# Patient Record
Sex: Male | Born: 1980 | Race: White | Hispanic: No | Marital: Married | Smoking: Never smoker
Health system: Southern US, Community
[De-identification: ages and names within clinical notes are randomized; demographics above are authoritative.]

## PROBLEM LIST (undated history)

## (undated) DIAGNOSIS — C8190 Hodgkin lymphoma, unspecified, unspecified site: Secondary | ICD-10-CM

## (undated) HISTORY — PX: ELBOW SURGERY: SHX618

---

## 2004-12-19 ENCOUNTER — Emergency Department (HOSPITAL_COMMUNITY): Admission: EM | Admit: 2004-12-19 | Discharge: 2004-12-19 | Payer: Self-pay | Admitting: Emergency Medicine

## 2005-06-23 ENCOUNTER — Emergency Department (HOSPITAL_COMMUNITY): Admission: EM | Admit: 2005-06-23 | Discharge: 2005-06-23 | Payer: Self-pay | Admitting: Emergency Medicine

## 2005-07-23 ENCOUNTER — Emergency Department (HOSPITAL_COMMUNITY): Admission: EM | Admit: 2005-07-23 | Discharge: 2005-07-23 | Payer: Self-pay | Admitting: Emergency Medicine

## 2005-08-25 ENCOUNTER — Emergency Department (HOSPITAL_COMMUNITY): Admission: EM | Admit: 2005-08-25 | Discharge: 2005-08-25 | Payer: Self-pay | Admitting: Emergency Medicine

## 2005-10-10 ENCOUNTER — Emergency Department (HOSPITAL_COMMUNITY): Admission: EM | Admit: 2005-10-10 | Discharge: 2005-10-10 | Payer: Self-pay | Admitting: Emergency Medicine

## 2005-11-16 ENCOUNTER — Emergency Department (HOSPITAL_COMMUNITY): Admission: EM | Admit: 2005-11-16 | Discharge: 2005-11-16 | Payer: Self-pay | Admitting: Emergency Medicine

## 2006-01-16 ENCOUNTER — Emergency Department (HOSPITAL_COMMUNITY): Admission: EM | Admit: 2006-01-16 | Discharge: 2006-01-16 | Payer: Self-pay | Admitting: Emergency Medicine

## 2006-01-19 ENCOUNTER — Emergency Department (HOSPITAL_COMMUNITY): Admission: EM | Admit: 2006-01-19 | Discharge: 2006-01-19 | Payer: Self-pay | Admitting: Emergency Medicine

## 2006-02-08 ENCOUNTER — Emergency Department (HOSPITAL_COMMUNITY): Admission: EM | Admit: 2006-02-08 | Discharge: 2006-02-08 | Payer: Self-pay | Admitting: Emergency Medicine

## 2006-03-11 ENCOUNTER — Emergency Department (HOSPITAL_COMMUNITY): Admission: EM | Admit: 2006-03-11 | Discharge: 2006-03-11 | Payer: Self-pay | Admitting: Emergency Medicine

## 2006-10-03 ENCOUNTER — Ambulatory Visit: Payer: Self-pay | Admitting: Internal Medicine

## 2006-10-05 ENCOUNTER — Ambulatory Visit (HOSPITAL_COMMUNITY): Admission: RE | Admit: 2006-10-05 | Discharge: 2006-10-05 | Payer: Self-pay | Admitting: Internal Medicine

## 2006-10-05 ENCOUNTER — Encounter: Payer: Self-pay | Admitting: Internal Medicine

## 2006-10-05 ENCOUNTER — Ambulatory Visit: Payer: Self-pay | Admitting: Internal Medicine

## 2006-10-14 ENCOUNTER — Ambulatory Visit (HOSPITAL_COMMUNITY): Admission: RE | Admit: 2006-10-14 | Discharge: 2006-10-14 | Payer: Self-pay | Admitting: Internal Medicine

## 2006-12-08 ENCOUNTER — Ambulatory Visit: Payer: Self-pay | Admitting: Internal Medicine

## 2006-12-13 ENCOUNTER — Encounter (HOSPITAL_COMMUNITY): Admission: RE | Admit: 2006-12-13 | Discharge: 2007-01-04 | Payer: Self-pay | Admitting: Internal Medicine

## 2007-01-12 ENCOUNTER — Ambulatory Visit: Payer: Self-pay | Admitting: Family Medicine

## 2007-01-12 DIAGNOSIS — C8190 Hodgkin lymphoma, unspecified, unspecified site: Secondary | ICD-10-CM | POA: Insufficient documentation

## 2007-01-12 DIAGNOSIS — I252 Old myocardial infarction: Secondary | ICD-10-CM | POA: Insufficient documentation

## 2007-01-12 DIAGNOSIS — G43009 Migraine without aura, not intractable, without status migrainosus: Secondary | ICD-10-CM | POA: Insufficient documentation

## 2007-01-12 DIAGNOSIS — F329 Major depressive disorder, single episode, unspecified: Secondary | ICD-10-CM

## 2007-01-12 DIAGNOSIS — F411 Generalized anxiety disorder: Secondary | ICD-10-CM | POA: Insufficient documentation

## 2007-01-12 DIAGNOSIS — K219 Gastro-esophageal reflux disease without esophagitis: Secondary | ICD-10-CM | POA: Insufficient documentation

## 2007-01-18 ENCOUNTER — Encounter (INDEPENDENT_AMBULATORY_CARE_PROVIDER_SITE_OTHER): Payer: Self-pay | Admitting: Family Medicine

## 2007-01-25 ENCOUNTER — Encounter (INDEPENDENT_AMBULATORY_CARE_PROVIDER_SITE_OTHER): Payer: Self-pay | Admitting: Family Medicine

## 2007-01-26 ENCOUNTER — Ambulatory Visit: Payer: Self-pay | Admitting: Family Medicine

## 2007-01-26 DIAGNOSIS — K8020 Calculus of gallbladder without cholecystitis without obstruction: Secondary | ICD-10-CM | POA: Insufficient documentation

## 2007-04-14 ENCOUNTER — Emergency Department (HOSPITAL_COMMUNITY): Admission: EM | Admit: 2007-04-14 | Discharge: 2007-04-14 | Payer: Self-pay | Admitting: Emergency Medicine

## 2007-04-19 ENCOUNTER — Emergency Department (HOSPITAL_COMMUNITY): Admission: EM | Admit: 2007-04-19 | Discharge: 2007-04-19 | Payer: Self-pay | Admitting: Emergency Medicine

## 2009-05-17 IMAGING — NM NM HEPATO W/GB/PHARM/[PERSON_NAME]
2 series · 12 of 12 positions shown · non-contrast
Comparison: none

HISTORY: Abdominal pain

[Series 1: hepatobiliary · 3.20mm/px · 6 of 60 frames shown (1 of 2)]
[frame 6/60]
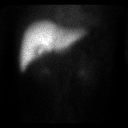
[frame 16/60]
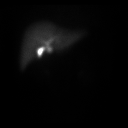
[frame 26/60]
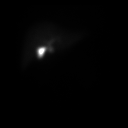
[frame 36/60]
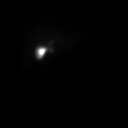
[frame 46/60]
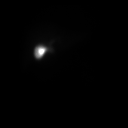
[frame 56/60]
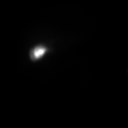

[Series 1: hepatobiliary · 3.20mm/px · 6 of 60 frames shown (2 of 2)]
[frame 6/60]
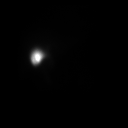
[frame 16/60]
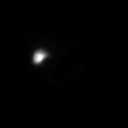
[frame 26/60]
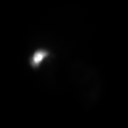
[frame 36/60]
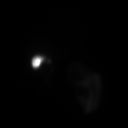
[frame 46/60]
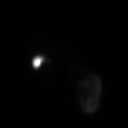
[frame 56/60]
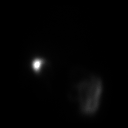

[12 of 12 positions shown; findings below may reference images not displayed]

HEPATOBILIARY SCAN WITH EJECTION FRACTION:

Hepatobiliary imaging performed using 5 mCi Fc-QQm mebrofenin.

Prompt tracer extraction from blood stream, indicating normal hepatocellular
function.
Prompt excretion of tracer into biliary tree.
Gallbladder visualized x 10 minutes.
Small bowel activity not definitely identified until following fatty meal
stimulation.
No focal hepatic retention of tracer.

At 1 hour, patient ingested half-and-half, and imaging was continued for 60
minutes.
Subjectively. low normal emptying of tracer occurs from gallbladder following
fatty meal stimulation.
Calculated gallbladder ejection fraction 53%, normal.
IMPRESSION: Normal exam.

## 2010-01-25 ENCOUNTER — Encounter: Payer: Self-pay | Admitting: Internal Medicine

## 2010-05-19 NOTE — Assessment & Plan Note (Signed)
NAMEMARKEZ, DOWLAND                  CHART#:  [id number]   DATE:  12/08/2006                       DOB:  1980-01-31   1. Follow up eosinophilic esophagitis, biopsy-proven.  2. Epigastric pain, cholelithiasis on ultrasound.  3. History of lymphoma.  4. History of recurrent pleural effusion requiring pleurodesis      previously.   Corey Gray returns after being seen by me on October 05, 2006.  He  underwent an EGD.  Esophagus appeared abnormal.  Biopsies were  consistent with eosinophilic esophagitis.  Patient also had grade 2 to 3  esophageal varices.  There was an abnormal appearing area in the antrum  which had a leukoplakia type appearing mucosa.  His esophagus was  dilated at that time as well and multiple biopsies of antrum were taken  for histologic study.  Biopsies of gastric mucosa revealed a chronic  __________ gastritis with no evidence of H. pylori.  There are some  mucosal erosions and microthrombi present.  There was no evidence of  intestinal metaplasia or malignancy identified.  His lymphoma has been  in remission now for nearly 5 years.   Corey Gray has lost 3 pounds since his original visit here in September.  He does not have any early satiety, he does not have any nausea,  vomiting, melena, hematochezia.  His dysphagia symptoms have improved  and reflux symptoms improved significantly after going on a course of  orally administered fluticasone.  He continues to take Protonix 40 mg  orally b.i.d.  He has a history of recurrent pulmonary and pericardial  effusions which required draining previously in [place].  He has  undergone pleurodesis.  No one told him as to the etiology of the  pleural effusion.  Apparently there was no evidence of recurrent  lymphoma.  Apparently he did have a Port-A-Cath at one point and did  have some venous clotting (query portal vein thrombosis with his  esophageal varices seen recently).  Labs from October 1 demonstrated an  indirect  bilirubin slightly elevated at 1, albumin 3.7, transaminases  were normal.  He had a mildly depressed hematocrit to 38.4%, white count  5.3.  INR is 1.  Corey Gray tells me his esophagus is really not the  problem now days.  He has continued postprandial epigastric pain with  some radiation in the right upper quadrant.  It comes and goes.  Sometimes he has pain without eating, but generally it is after he eats  a meal.   CURRENT MEDICATIONS:  See updated list.   ALLERGIES:  1. VIOXX.  2. PENICILLIN.   EXAM:  Today, he appears well, he is accompanied by his wife, weight  214, is down 3 pounds.  Height 6 feet 4 inches, temp 97.9, BP 112/78,  pulse 68.  Skin warm and dry, there is no jaundice.  HEENT EXAM:  No scleral icterus, conjunctiva are pink.  There is no  cervical adenopathy.  CHEST:  Lungs are clear to auscultation.  CARDIAC EXAM:  Regular rate and rhythm without murmur, gallop, rub.  ABDOMEN:  Flat, positive bowel sounds, soft, nontender without  appreciable mass or organomegaly.  EXTREMITY EXAM:  No edema.   IMPRESSION:  1. Eosinophilic esophagitis, biopsy-proven; responded to topical      fluticasone and now proton pump inhibitor therapy.  Dysphagia is      not an issue.  I told him at some point between now and the first      of the year he ought to go see an allergist to be tested for      allergens as this is often very helpful in quelling the immune      response in the esophagus.  2. Postprandial epigastric right upper quadrant abdominal pain.  He      has a single gallstone seen in his gallbladder on prior ultrasound.      He has esophageal varices and may have a splenic vein and/or portal      vein thrombosis related to prior episode of Port-A-Cath      chemotherapy for lymphoma.  I do not believe this gentleman has      advanced chronic liver disease.   RECOMMENDATIONS:  Will proceed with a HIDA, with fatty meal, in the very  near future to further evaluate  this gentleman for gallbladder  dysfunction and if this study is suggestive of gallbladder dysfunction  will more towards cholecystectomy.  However, I think it would be a good  idea to get a CT of the abdomen and pelvis prior to any surgical  intervention to see how his intraabdominal anatomy looks.       Jonathon Bellows, M.D.  Electronically Signed     RMR/MEDQ  D:  12/08/2006  T:  12/08/2006  Job:  [id number]   cc:   Dineen Kid. Elijah Birk, M.D.

## 2010-05-19 NOTE — Op Note (Signed)
NAME:  Corey Gray, Corey Gray                 ACCOUNT NO.:  [id number]   MEDICAL RECORD NO.:  [id number]          PATIENT TYPE:  AMB   LOCATION:  DAY                           FACILITY:  [facility]   PHYSICIAN:  R. Roetta Sessions, M.D. DATE OF BIRTH:  1980-04-25   DATE OF PROCEDURE:  10/05/2006  DATE OF DISCHARGE:                               OPERATIVE REPORT   PROCEDURE:  EGD with Elease Hashimoto dilation followed by biopsy.   INDICATIONS FOR PROCEDURE:  A 30 year old gentleman with history of  recurrent esophageal dysphagia.  He has had his esophagus dilated  multiple times previously.  He also has heartburn all the time for which  he takes Prilosec and more recently Protonix 40 grams daily and he tells  me these agents really do not make much of a dent in his dysphagia  symptoms, history of lymphoma back in 1999.  He is in remission.  He is  followed by Dr. Concepcion Living up in [place], [place].  EGD is now  being done.  This approach has discussed with the patient at length.  Potential risks, benefits, alternatives have been reviewed, questions  answered, he is agreeable.  Please see documentation in the medical  record.   PROCEDURE NOTE:  O2 saturation, blood pressure, pulse and respirations  were monitored throughout the entire procedure.   CONSCIOUS SEDATION:  Versed 6 mg IV, Demerol 125 mg IV, Phenergan 12.5  mg diluted, slow IV push to augment conscious sedation.  Cetacaine spray  for topical pharyngeal anesthesia.   INSTRUMENTATION:  Pentax video chip system.   FINDINGS:  Esophagus:  Examination of the tubular esophagus revealed an  unusually diffusely pale-appearing esophageal mucosa.  The patient had  three columns of grade 2-3 esophageal varices proximally.  The tubular  esophagus was patent through the EG junction.  I did not see a ring,  stricture or web.  There was no evidence of esophagitis and moreover  there was no evidence of trachealization or a feline appearance of the  esophagoscope to go with typical changes of eosinophilic esophagitis.  EG junction was easily traversed.   Stomach:  Gastric cavity was emptied and insufflated well with air.  Thorough examination of the gastric mucosa including retroflexed view of  the proximal stomach and esophagogastric junction demonstrated very  abnormal-appearing gastric mucosa.  There was a large area of verrucous-  appearing mucosa in a geographic distribution in the area of the antrum  up to the incisura.  It had an appearance of leukoplakia like lesion.  There was no erosion or ulcer crater seen.  Please see multiple  photographs taken.  Proximal stomach:  Evaluation of proximal stomach  demonstrated only a small hiatal hernia.  Pylorus was patent and easily  traversed.  Examination of the bulb and second portion revealed no  abnormalities.   THERAPEUTIC/DIAGNOSTIC MANEUVERS PERFORMED:  A 54-French Maloney dilator  was passed to full insertion with ease.  Look-back revealed a  superficial tear of the EG junction.  There was minimal bleeding with  this maneuver.  Subsequently, multiple biopsies of the abnormal-  appearing gastric  mucosa were taken for histologic study.  Subsequently,  mucosal biopsies of the esophagus were taken away from the varices and  the superficial tear at the EG junction for histologic study.  There was  minimal bleeding after all the above-mentioned maneuvers amounting to an  estimated blood loss less than 10 mL.  The patient tolerated the  procedure well as reactive to endoscopy.   IMPRESSION:  1. Diffusely abnormal, unusually pale-appearing esophageal mucosa.      Three columns of grade 2 to 3 esophageal varices proximally.  No      structural narrowing of the esophagus to account for dysphagia,      status post passage of 54-French [facility] dilator.  Status postop      esophageal biopsy.  2. Small hiatal hernia.  A very abnormal appearing area of gastric      mucosa as described  above, biopsied separately.  Patent pylorus,      normal D1 and D2.  These findings are interesting.  He does indeed      have esophageal varices for which he will need further evaluation.  3. The esophagus did not appear to be that seen typically with      eosinophilic esophagitis.  He took a Production assistant, radio      without any in the usual tearing after the procedure which we      typically seen with larger bore dilation in the setting of      eosinophilic esophagitis, esophageal mucosa biopsies will help sort      that out.  4. His gastric mucosa is very abnormal, etiology of which is not      certain.  Biopsies are pending.  For the time being, we will      continue acid suppression with Protonix 40 mg orally daily.  5. Follow up on pathology.  6. Hepatic ultrasound, CBC, LFTs and INR today.  Further      recommendations to follow.      Jonathon Bellows, M.D.  Electronically Signed     RMR/MEDQ  D:  10/05/2006  T:  10/05/2006  Job:  [id number]   cc:   Concepcion Living, M.D.  [place], [place]

## 2010-05-19 NOTE — H&P (Signed)
NAME:  Corey Gray, Corey Gray                 ACCOUNT NO.:  [id number]   MEDICAL RECORD NO.:  [id number]          PATIENT TYPE:  AMB   LOCATION:  DAY                           FACILITY:  [facility]   PHYSICIAN:  R. Roetta Sessions, M.D. DATE OF BIRTH:  1980-06-22   DATE OF ADMISSION:  DATE OF DISCHARGE:  LH                              HISTORY & PHYSICAL   CHIEF COMPLAINT:  Solid food dysphagia for a year.   HISTORY OF PRESENT ILLNESS:  Corey Gray is a 30 year old Caucasian male  who has a history of recurrent solid food dysphagia.  He tells me that  his last esophageal dilatation was approximately 18 months ago in  [place], [place].  He has a history of recurrent dysphagia, mostly to  solid foods.  He has had problems for about a year now.  He is taking  Prilosec 20 mg b.i.d.  He does have breakthrough heartburn and  indigestion, almost every time he eats.  He complains of nausea and  vomiting.  He has symptoms intermittently throughout the day.  He also  complains of upper abdominal pain below both rib cages, as well as  abdominal bloating.  He generally has between 2 and 3 soft, brown bowel  movements per day, denies rectal bleeding or melena.  He has previously  failed Nexium and Protonix.  His weight remains stable.   PAST MEDICAL AND SURGICAL HISTORY:  He had [facility] lymphoma in 1999  status post radiation and chemotherapy, and he was followed by Dr.  Francis Gaines.  He has history of chronic GERD with esophageal dysphagia,  status post multiple esophageal dilatations at [facility].  He has had right ankle surgery with plates and screws.  He  has had thoracentesis.   CURRENT MEDICATIONS:  1. Prilosec 20 mg b.i.d.  2. Lortab 10/100 mg q.4-6 hours p.r.n.  3. Flexeril 10 mg three times a day.  4. Triazolam 0.25 mg q.h.s.  5. Gabapentin 100 mg t.i.d.  6. Furosemide 20 mg daily.   ALLERGIES:  VIOXX AND PENICILLIN.   FAMILY HISTORY:  There is no known family history of  colorectal  carcinoma, liver, or chronic GI problems.   SOCIAL HISTORY:  Corey Gray is married.  He has one healthy 103-year-old  son.  He is unemployed.  He denies any tobacco or drug use.  He consumes  a couple of beers per week.   REVIEW OF SYSTEMS:  See HPI, otherwise negative.   PHYSICAL EXAM:  VITAL SIGNS: Weight 217, height 75 inches, temperature  98.3, blood pressure 118/90 and pulse 88.  GENERAL:  Corey Gray is an obese Caucasian male who is alert, pleasant  and cooperative, in no acute distress.  HEENT:  Clear, nonicteric conjunctivae pink.  Oropharynx pink and moist.  There is a lingual ornament.  NECK:  Supple without masses or thyromegaly.  CHEST:  Heart regular rate and rhythm, normal S1, S2 without murmurs,  rubs or gallops.  LUNGS:  Clear to auscultation bilaterally.  ABDOMEN:  Positive bowel sounds x4.  No bruits auscultated.  Soft,  nontender, nondistended, without  palpable mass or hepatosplenomegaly.  No rebound tenderness or guarding.  EXTREMITIES:  Without clubbing or edema bilaterally.  SKIN:  Pink, warm, dry without rash or jaundice.   IMPRESSION:  Corey Gray is a 30 year old Caucasian male with recurrent  esophageal dysphagia to solids.  He has chronic GERD.  I suspect he has  recurrence of either esophageal web ring or stricture.  It is also  possible he could have eosinophilic esophagitis, as well.  He is having  some breakthrough gastroesophageal reflux disease symptoms.   PLAN:  1. EGD with possible ED with Dr. Jena Gauss in the near future.  I      discussed this procedure and benefits, which included but are not      limited to bleeding, infection, perforation, drug reaction.  He      agrees with plan.  2. Discontinue Prilosec and begin Protonix 40 mg b.i.d.  I have given      a prescription for 62 with 5 refills.      Corey Gray, N.P.      Jonathon Bellows, M.D.  Electronically Signed    KJ/MEDQ  D:  10/03/2006  T:  10/03/2006  Job:   [id number]   cc:   Elby Showers, [place], [place]

## 2010-10-15 LAB — PROTIME-INR: INR: 1

## 2010-10-15 LAB — DIFFERENTIAL
Basophils Absolute: 0
Basophils Relative: 0
Eosinophils Absolute: 0.2
Lymphs Abs: 1.6
Monocytes Relative: 11
Neutrophils Relative %: 56

## 2010-10-15 LAB — HEPATIC FUNCTION PANEL
Albumin: 3.7
Alkaline Phosphatase: 78
Total Protein: 6.7

## 2010-10-15 LAB — CBC
HCT: 38.4 — ABNORMAL LOW
Hemoglobin: 13.5
MCV: 88
Platelets: 167
WBC: 5.3

## 2022-02-11 ENCOUNTER — Emergency Department (HOSPITAL_COMMUNITY)
Admission: EM | Admit: 2022-02-11 | Discharge: 2022-02-11 | Disposition: A | Discharge status: Home / Self Care | Payer: Self-pay | Attending: Emergency Medicine | Admitting: Emergency Medicine

## 2022-02-11 ENCOUNTER — Encounter (HOSPITAL_COMMUNITY): Payer: Self-pay | Admitting: Emergency Medicine

## 2022-02-11 ENCOUNTER — Emergency Department (HOSPITAL_COMMUNITY): Payer: Self-pay

## 2022-02-11 ENCOUNTER — Other Ambulatory Visit: Payer: Self-pay

## 2022-02-11 DIAGNOSIS — R052 Subacute cough: Secondary | ICD-10-CM

## 2022-02-11 DIAGNOSIS — R911 Solitary pulmonary nodule: Secondary | ICD-10-CM | POA: Insufficient documentation

## 2022-02-11 HISTORY — DX: Hodgkin lymphoma, unspecified, unspecified site: C81.90

## 2022-02-11 LAB — BASIC METABOLIC PANEL
Anion gap: 9 (ref 5–15)
BUN: 22 mg/dL — ABNORMAL HIGH (ref 6–20)
CO2: 26 mmol/L (ref 22–32)
Calcium: 8.7 mg/dL — ABNORMAL LOW (ref 8.9–10.3)
Chloride: 101 mmol/L (ref 98–111)
Creatinine, Ser: 1.49 mg/dL — ABNORMAL HIGH (ref 0.61–1.24)
GFR, Estimated: >60 mL/min (ref >60)
Glucose, Bld: 94 mg/dL (ref 70–99)
Potassium: 4 mmol/L (ref 3.5–5.1)
Sodium: 136 mmol/L (ref 135–145)

## 2022-02-11 LAB — CBC WITH DIFFERENTIAL/PLATELET
Abs Immature Granulocytes: 0.02 10*3/uL (ref 0.00–0.07)
Basophils Absolute: 0 10*3/uL (ref 0.0–0.1)
Basophils Relative: 1 %
Eosinophils Absolute: 0.5 10*3/uL (ref 0.0–0.5)
Eosinophils Relative: 8 %
HCT: 39.7 % (ref 39.0–52.0)
Hemoglobin: 13.7 g/dL (ref 13.0–17.0)
Immature Granulocytes: 0 %
Lymphocytes Relative: 29 %
Lymphs Abs: 1.8 10*3/uL (ref 0.7–4.0)
MCH: 32.3 pg (ref 26.0–34.0)
MCHC: 34.5 g/dL (ref 30.0–36.0)
MCV: 93.6 fL (ref 80.0–100.0)
Monocytes Absolute: 0.7 10*3/uL (ref 0.1–1.0)
Monocytes Relative: 12 %
Neutro Abs: 3.1 10*3/uL (ref 1.7–7.7)
Neutrophils Relative %: 50 %
Platelets: 258 10*3/uL (ref 150–400)
RBC: 4.24 MIL/uL (ref 4.22–5.81)
RDW: 14.6 % (ref 11.5–15.5)
WBC: 6.1 10*3/uL (ref 4.0–10.5)
nRBC: 0 % (ref 0.0–0.2)

## 2022-02-11 LAB — BRAIN NATRIURETIC PEPTIDE: B Natriuretic Peptide: 11 pg/mL (ref 0.0–100.0)

## 2022-02-11 MED ORDER — IPRATROPIUM-ALBUTEROL 0.5-2.5 (3) MG/3ML IN SOLN
3.0000 mL | Freq: Once | RESPIRATORY_TRACT | Status: AC
  Administered 2022-02-11: 3 mL via RESPIRATORY_TRACT
  Filled 2022-02-11: qty 3

## 2022-02-11 MED ORDER — HYDROCODONE BIT-HOMATROP MBR 5-1.5 MG/5ML PO SOLN: 5.0000 mL | Freq: Four times a day (QID) | ORAL | 0 refills | Status: AC | PRN

## 2022-02-11 MED ORDER — IOHEXOL 350 MG/ML SOLN
100.0000 mL | Freq: Once | INTRAVENOUS | Status: AC | PRN
  Administered 2022-02-11: 100 mL via INTRAVENOUS

## 2022-02-11 MED ORDER — HYDROCODONE BIT-HOMATROP MBR 5-1.5 MG/5ML PO SOLN
5.0000 mL | Freq: Once | ORAL | Status: AC
  Administered 2022-02-11: 5 mL via ORAL
  Filled 2022-02-11: qty 5

## 2022-02-11 NOTE — ED Provider Notes (Signed)
[facility] Provider Note   CSN: [id number] Arrival date & time: 02/11/22  0126     History  Chief Complaint  Patient presents with   Cough    Corey Gray is a 42 y.o. male.  42 year old male without significant past medical history aside from lymphoma in remission the presents the ER today secondary to persistent cough and wheezing.  Was seen a couple weeks ago in [place] diagnosed with what sounds like bronchitis with started on antibiotics, prednisone and inhaler.  States that it did not get much better.  He states he was working around mold the day this started.  He does not smoke.  No infectious symptoms prior to it starting and on now.  No lower extremity swelling.  No history of similar symptoms that lasted this long.  No specific pain or weight loss.   Cough      Home Medications Prior to Admission medications   Medication Sig Start Date End Date Taking? Authorizing Provider  HYDROcodone bit-homatropine (HYCODAN) 5-1.5 MG/5ML syrup Take 5 mLs by mouth every 6 (six) hours as needed for cough. 02/11/22  Yes Aldrick Derrig, Corene Cornea, MD      Allergies    Penicillins and Rofecoxib    Review of Systems   Review of Systems  Respiratory:  Positive for cough.     Physical Exam Updated Vital Signs BP 114/80   Pulse 79   Temp 97.7 F (36.5 C) (Oral)   Resp 15   Ht '6\' 3"'$  (1.905 m)   Wt 94.3 kg   SpO2 98%   BMI 26.00 kg/m  Physical Exam Vitals and nursing note reviewed.  HENT:     Nose: Nose normal. No congestion or rhinorrhea.     Mouth/Throat:     Mouth: Mucous membranes are moist.  Eyes:     Pupils: Pupils are equal, round, and reactive to light.  Pulmonary:     Effort: Pulmonary effort is normal.  Musculoskeletal:        General: No swelling or tenderness. Normal range of motion.  Skin:    General: Skin is warm and dry.     ED Results / Procedures / Treatments   Labs (all labs ordered are listed, but only abnormal  results are displayed) Labs Reviewed  BASIC METABOLIC PANEL - Abnormal; Notable for the following components:      Result Value   BUN 22 (*)    Creatinine, Ser 1.49 (*)    Calcium 8.7 (*)    All other components within normal limits  CBC WITH DIFFERENTIAL/PLATELET  BRAIN NATRIURETIC PEPTIDE    EKG None  Radiology CT Angio Chest PE W and/or Wo Contrast  Result Date: 02/11/2022 CLINICAL DATA:  Pulmonary embolism (PE) suspected, high prob. Cough, wheezing EXAM: CT ANGIOGRAPHY CHEST WITH CONTRAST TECHNIQUE: Multidetector CT imaging of the chest was performed using the standard protocol during bolus administration of intravenous contrast. Multiplanar CT image reconstructions and MIPs were obtained to evaluate the vascular anatomy. RADIATION DOSE REDUCTION: This exam was performed according to the departmental dose-optimization program which includes automated exposure control, adjustment of the mA and/or kV according to patient size and/or use of iterative reconstruction technique. CONTRAST:  155m OMNIPAQUE IOHEXOL 350 MG/ML SOLN COMPARISON:  None Available. FINDINGS: Cardiovascular: No filling defects in the pulmonary arteries to suggest pulmonary emboli. Heart is normal size. Aorta is normal caliber. Extensive right chest wall collateral venous channels draining into the azygous system. Probable central venous  stenosis or occlusion on the right. Mediastinum/Nodes: Extensive venous collateral channels in the mediastinum. No mediastinal, hilar, or axillary adenopathy. Trachea and esophagus are unremarkable. Thyroid unremarkable. Lungs/Pleura: Nodular area in the right upper lobe measures 11 mm on image 61. Subpleural nodular areas in the posterior left lower lobe measuring 9 mm in 8 mm adjacent to 1 another. No effusions. Upper Abdomen: No acute findings. Musculoskeletal: No acute bony abnormality. Review of the MIP images confirms the above findings. IMPRESSION: No evidence of pulmonary embolus.  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 11 mm nodular area in the right upper lobe. Adjacent subpleural nodular areas in the left lower lobe measuring up to 9 mm. Non-contrast chest CT at 3-6 months is recommended. If the nodules are stable at time of repeat CT, then future CT at 18-24 months (from today's scan) is considered optional for low-risk patients, but is recommended for high-risk patients. This recommendation follows the consensus statement: Guidelines for Management of Incidental Pulmonary Nodules Detected on CT Images: From the Fleischner Society 2017; Radiology 2017; 284:228-243. Electronically Signed   By: Rolm Baptise M.D.   On: 02/11/2022 03:52   DG Chest 2 View  Result Date: 02/11/2022 CLINICAL DATA:  Cough, wheezing EXAM: CHEST - 2 VIEW COMPARISON:  08/25/2005 FINDINGS: Blunting of the left costophrenic angle may reflect small pleural effusion. No confluent airspace opacities. Heart mediastinal contours within normal limits. No acute bony abnormality. IMPRESSION: Blunting of left costophrenic angle compatible with small effusion. Electronically Signed   By: Rolm Baptise M.D.   On: 02/11/2022 02:07    Procedures Procedures    Medications Ordered in ED Medications  ipratropium-albuterol (DUONEB) 0.5-2.5 (3) MG/3ML nebulizer solution 3 mL (3 mLs Nebulization Given 02/11/22 0217)  iohexol (OMNIPAQUE) 350 MG/ML injection 100 mL (100 mLs Intravenous Contrast Given 02/11/22 0324)  HYDROcodone bit-homatropine (HYCODAN) 5-1.5 MG/5ML syrup 5 mL (5 mLs Oral Given 02/11/22 4196)    ED Course/ Medical Decision Making/ A&P                             Medical Decision Making Amount and/or Complexity of Data Reviewed Labs: ordered. Radiology: ordered. ECG/medicine tests: ordered.  Risk Prescription drug management.  Could just be prolonged cough that  COVID is however on my interpretation of his x-ray does like he has a small left lateral effusion and some other opacities that could be scarring versus an acute issue.  Will go ahead and get a CT scan to evaluate for PE and give her look at his lungs.  Will try DuoNeb to see if that helps.  Reevaluate for disposition. CT scan negative for PE on my interpretation.  Radiology notes some pulmonary nodules so he will follow-up with his primary doctor 36 months get those rechecked.  No obvious pneumonia.  He has some vascular occlusion that he knows about from having a port. Patient will get some cough medicine if not improving in another week to 10 days will follow-up with pulmonology for further evaluation.    Final Clinical Impression(s) / ED Diagnoses Final diagnoses:  Subacute cough  Pulmonary nodule    Rx / DC Orders ED Discharge Orders          Ordered    HYDROcodone bit-homatropine (HYCODAN) 5-1.5 MG/5ML syrup  Every 6 hours PRN  02/11/22 0502    Ambulatory referral to Pulmonology        02/11/22 0504              Nazire Fruth, Corene Cornea, MD 02/11/22 [phone]

## 2022-02-11 NOTE — ED Triage Notes (Signed)
Pt c/o cough and wheezing for the past several weeks. Pt states he was seen at [facility] ED about 10 days ago and prescribed inhaler, steroid and abx. Pt states no improvement.

## 2022-04-20 NOTE — Progress Notes (Deleted)
   Corey Gray, male    DOB: September 07, 1980    MRN: [id number]   Brief patient profile:  42  yo*** *** referred to pulmonary clinic in [place]  04/21/2022 by *** for ***      History of Present Illness  04/21/2022  Pulmonary/ 1st office eval/ Sherene Sires / [place] Office  No chief complaint on file.    Dyspnea:  *** Cough: *** Sleep: *** SABA use: *** 02: *** Lung cancer screen: ***  Past Medical History:  Diagnosis Date   Hodgkin's disease (HCC)     Outpatient Medications Prior to Visit  Medication Sig Dispense Refill   HYDROcodone bit-homatropine (HYCODAN) 5-1.5 MG/5ML syrup Take 5 mLs by mouth every 6 (six) hours as needed for cough. 120 mL 0   No facility-administered medications prior to visit.     Objective:     There were no vitals taken for this visit.         Assessment   No problem-specific Assessment & Plan notes found for this encounter.     Sandrea Hughs, MD 04/20/2022

## 2022-04-21 ENCOUNTER — Institutional Professional Consult (permissible substitution): Payer: Self-pay | Admitting: Internal Medicine

## 2022-04-21 ENCOUNTER — Institutional Professional Consult (permissible substitution): Payer: Self-pay | Admitting: Pulmonary Disease

## 2022-06-06 NOTE — Progress Notes (Deleted)
   Corey Gray, male    DOB: April 29, 1980    MRN: [id number]   Brief patient profile:  42  yo*** *** referred to pulmonary clinic in [place]  06/07/2022 by *** for ***      History of Present Illness  06/07/2022  Pulmonary/ 1st office eval/ Sherene Sires / [place] Office  No chief complaint on file.    Dyspnea:  *** Cough: *** Sleep: *** SABA use: *** 02: *** Lung cancer screen: ***  Past Medical History:  Diagnosis Date   Hodgkin's disease (HCC)     Outpatient Medications Prior to Visit  Medication Sig Dispense Refill   HYDROcodone bit-homatropine (HYCODAN) 5-1.5 MG/5ML syrup Take 5 mLs by mouth every 6 (six) hours as needed for cough. 120 mL 0   No facility-administered medications prior to visit.     Objective:     There were no vitals taken for this visit.         Assessment   No problem-specific Assessment & Plan notes found for this encounter.     Sandrea Hughs, MD 06/06/2022

## 2022-06-07 ENCOUNTER — Institutional Professional Consult (permissible substitution): Payer: Self-pay | Admitting: Internal Medicine

## 2023-02-06 ENCOUNTER — Other Ambulatory Visit: Payer: Self-pay

## 2023-02-06 ENCOUNTER — Emergency Department (HOSPITAL_COMMUNITY)
Admission: EM | Admit: 2023-02-06 | Discharge: 2023-02-06 | Disposition: A | Discharge status: Home / Self Care | Payer: Self-pay | Attending: Emergency Medicine | Admitting: Emergency Medicine

## 2023-02-06 ENCOUNTER — Encounter (HOSPITAL_COMMUNITY): Payer: Self-pay | Admitting: *Deleted

## 2023-02-06 DIAGNOSIS — Z20822 Contact with and (suspected) exposure to covid-19: Secondary | ICD-10-CM | POA: Insufficient documentation

## 2023-02-06 DIAGNOSIS — J101 Influenza due to other identified influenza virus with other respiratory manifestations: Secondary | ICD-10-CM | POA: Insufficient documentation

## 2023-02-06 LAB — RESP PANEL BY RT-PCR (RSV, FLU A&B, COVID)  RVPGX2
Influenza A by PCR: POSITIVE — AB
Influenza B by PCR: NEGATIVE
Resp Syncytial Virus by PCR: NEGATIVE
SARS Coronavirus 2 by RT PCR: NEGATIVE

## 2023-02-06 MED ORDER — BENZONATATE 100 MG PO CAPS: 100.0000 mg | ORAL_CAPSULE | Freq: Three times a day (TID) | ORAL | 0 refills | Status: AC

## 2023-02-06 NOTE — Discharge Instructions (Signed)
Your testing positive for the flu, this is not surprising given the exposure that she had to your family member and unfortunately this can last for the better part of 10 days or longer in some people, especially the cough.  I have recommended that you start taking a prescription cough medicine like benzonatate which I have prescribed, this can be taken 3 times a day as needed.  It is also safe to use this with DayQuil or NyQuil.  Please return for severe worsening symptoms otherwise see your doctor in 1 week, if you do not have a doctor see the list below  [facility] Primary Care Doctor List    Syliva Overman, MD. Specialty: [facility] Medicine Contact information: [address]  [place] [address]  [phone]   Lilyan Punt, MD. Specialty: Family Medicine Contact information: [address]  [place] [address]  [phone]   Avon Gully, MD Specialty: Internal Medicine Contact information: [address] [place] [address]  [phone]   Catalina Pizza, MD. Specialty: Internal Medicine Contact information: [address]  [place] [address]  [phone]    [facility] (Dr. Selena Batten) Specialty: Family Medicine Contact information: [address]  [place] [address]  [phone]   Jesstin Giovanni, MD. Specialty: [facility] Medicine Contact information: [address]  [address]  [place] [address]  [phone]   Carylon Perches, MD. Specialty: Internal Medicine Contact information: [address]  [address]  [place] [address]  [phone]   [facility]: [address] [phone]  [facility], Family medicine [address]  [phone]  [facility] [address] [place], [place] [phone]  [facility]: [address] [phone]    [facility] - Benita Stab[facility]  [address] [place], [place] [address] [phone]  Services [facility] offers a variety of basic health services.  Services include but are not limited to: Blood pressure checks  Heart rate checks  Blood sugar checks  Urine analysis  Rapid strep tests  Pregnancy tests.  Health education and referrals  People needing more complex services will be directed to a physician online. Using these virtual visits, doctors can evaluate and prescribe medicine and treatments. There will be no medication on-site, though [facility] will help patients fill their prescriptions at little to no cost.   For More information please go to: [email or web address]  Allergy and Asthma:    [address] [place] [phone]  Urology:  [address]  [place] [phone]  [facility]  [address] [place], [place] [phone]  Orthopedics   [address] [place], [place] [phone]  Endocrinology  [address] [place], [place] [phone]  Podiatry: [facility] [phone]

## 2023-02-06 NOTE — ED Provider Notes (Signed)
[place] EMERGENCY DEPARTMENT AT [facility] Provider Note   CSN: [id number] Arrival date & time: 02/06/23  1443     History  Chief Complaint  Patient presents with   Cough    Corey Gray is a 43 y.o. male.   Cough  This patient is a 43 year old male, he denies any chronic medical problems, he has currently been sick with an illness that started about 5 days ago including fevers, chills, headache, sore throat, cough, he has been using over-the-counter medication but over 5 days he has not improved.  He denies swelling, vomiting or diarrhea.  He was exposed to a family member with the flu last week.    Home Medications Prior to Admission medications   Medication Sig Start Date End Date Taking? Authorizing Provider  benzonatate (TESSALON) 100 MG capsule Take 1 capsule (100 mg total) by mouth every 8 (eight) hours. 02/06/23  Yes Eber Hong, MD  HYDROcodone bit-homatropine [employer]) 5-1.5 MG/5ML syrup Take 5 mLs by mouth every 6 (six) hours as needed for cough. 02/11/22   Mesner, Barbara Cower, MD      Allergies    Penicillins and Rofecoxib    Review of Systems   Review of Systems  Respiratory:  Positive for cough.   All other systems reviewed and are negative.   Physical Exam Updated Vital Signs BP 119/67 (BP Location: Right Arm)   Pulse 90   Temp 97.9 F (36.6 C) (Oral)   Resp 16   Ht 1.905 m (6\' 3" )   Wt 90.7 kg   SpO2 100%   BMI 25.00 kg/m  Physical Exam Vitals and nursing note reviewed.  Constitutional:      General: He is not in acute distress.    Appearance: He is well-developed.  HENT:     Head: Normocephalic and atraumatic.     Mouth/Throat:     Pharynx: No oropharyngeal exudate.  Eyes:     General: No scleral icterus.       Right eye: No discharge.        Left eye: No discharge.     Conjunctiva/sclera: Conjunctivae normal.     Pupils: Pupils are equal, round, and reactive to light.  Neck:     Thyroid: No thyromegaly.     Vascular: No JVD.   Cardiovascular:     Rate and Rhythm: Normal rate and regular rhythm.     Heart sounds: Normal heart sounds. No murmur heard.    No friction rub. No gallop.  Pulmonary:     Effort: Pulmonary effort is normal. No respiratory distress.     Breath sounds: Normal breath sounds. No wheezing or rales.  Abdominal:     General: Bowel sounds are normal. There is no distension.     Palpations: Abdomen is soft. There is no mass.     Tenderness: There is no abdominal tenderness.  Musculoskeletal:        General: No tenderness. Normal range of motion.     Cervical back: Normal range of motion and neck supple.     Right lower leg: No edema.     Left lower leg: No edema.  Lymphadenopathy:     Cervical: No cervical adenopathy.  Skin:    General: Skin is warm and dry.     Findings: No erythema or rash.  Neurological:     Mental Status: He is alert.     Coordination: Coordination normal.  Psychiatric:        Behavior: Behavior  normal.     ED Results / Procedures / Treatments   Labs (all labs ordered are listed, but only abnormal results are displayed) Labs Reviewed  RESP PANEL BY RT-PCR (RSV, FLU A&B, COVID)  RVPGX2 - Abnormal; Notable for the following components:      Result Value   Influenza A by PCR POSITIVE (*)    All other components within normal limits    EKG None  Radiology No results found.  Procedures Procedures    Medications Ordered in ED Medications - No data to display  ED Course/ Medical Decision Making/ A&P                                 Medical Decision Making Risk Prescription drug management.   Coughing shortness of breath and a viral type illness This patient presents to the ED for concern of coughing and fever with sore throat and headache differential diagnosis includes viral, bacterial, pneumonia, the lungs are totally clear so x-ray will not be obtained unless he is negative for COVID flu and RSV    Additional history obtained:  Additional  history obtained from medical record External records from outside source obtained and reviewed including head visit about a year ago for similar things, follows in the outpatient setting very rarely, no admissions to the hospital   Lab Tests:  I Ordered, and personally interpreted labs.  The pertinent results include: Influenza positive     Medicines ordered and prescription drug management:  I ordered medication including patient is otherwise well-appearing, medications including benzonatate prescribed for outpatient care for his cough for influenza Reevaluation of the patient after these medicines showed that the patient stable I have reviewed the patients home medicines and have made adjustments as needed   Problem List / ED Course:  This patient is well-appearing, vital signs are normal, he is not hypoxic tachycardic or febrile   Social Determinants of Health:  None  I have discussed with the patient at the bedside the results, and the meaning of these results.  They have had opportunity to ask questions,  expressed their understanding to the need for follow-up with primary care physician           Final Clinical Impression(s) / ED Diagnoses Final diagnoses:  Influenza A    Rx / DC Orders ED Discharge Orders          Ordered    benzonatate (TESSALON) 100 MG capsule  Every 8 hours        02/06/23 1552              Eber Hong, MD 02/06/23 1554

## 2023-02-06 NOTE — ED Triage Notes (Signed)
Pt with cough since Tuesday. + chills + HA + sore throat Tried OTC medication without relief.
